# Patient Record
Sex: Female | Born: 1970 | Hispanic: No | Marital: Married | Smoking: Never smoker
Health system: Southern US, Community
[De-identification: ages and names within clinical notes are randomized; demographics above are authoritative.]

---

## 2003-06-12 ENCOUNTER — Ambulatory Visit (HOSPITAL_COMMUNITY): Admission: RE | Admit: 2003-06-12 | Discharge: 2003-06-12 | Payer: Self-pay | Admitting: Family Medicine

## 2005-08-31 ENCOUNTER — Other Ambulatory Visit: Admission: RE | Admit: 2005-08-31 | Discharge: 2005-08-31 | Payer: Self-pay

## 2006-04-19 ENCOUNTER — Other Ambulatory Visit: Admission: RE | Admit: 2006-04-19 | Discharge: 2006-04-19 | Payer: Self-pay | Admitting: Unknown Physician Specialty

## 2011-01-01 ENCOUNTER — Emergency Department (HOSPITAL_COMMUNITY): Payer: Managed Care, Other (non HMO)

## 2011-01-01 ENCOUNTER — Emergency Department (HOSPITAL_COMMUNITY)
Admission: EM | Admit: 2011-01-01 | Discharge: 2011-01-02 | Disposition: A | Discharge status: Home / Self Care | Payer: Managed Care, Other (non HMO) | Attending: Emergency Medicine | Admitting: Emergency Medicine

## 2011-01-01 DIAGNOSIS — R110 Nausea: Secondary | ICD-10-CM | POA: Insufficient documentation

## 2011-01-01 DIAGNOSIS — R079 Chest pain, unspecified: Secondary | ICD-10-CM | POA: Insufficient documentation

## 2011-01-01 DIAGNOSIS — R55 Syncope and collapse: Secondary | ICD-10-CM | POA: Insufficient documentation

## 2011-01-01 DIAGNOSIS — R0609 Other forms of dyspnea: Secondary | ICD-10-CM | POA: Insufficient documentation

## 2011-01-01 DIAGNOSIS — R0989 Other specified symptoms and signs involving the circulatory and respiratory systems: Secondary | ICD-10-CM | POA: Insufficient documentation

## 2011-01-01 LAB — URINALYSIS, ROUTINE W REFLEX MICROSCOPIC
Bilirubin Urine: NEGATIVE
Glucose, UA: NEGATIVE mg/dL
Hgb urine dipstick: NEGATIVE
Ketones, ur: NEGATIVE mg/dL
Leukocytes, UA: NEGATIVE
Nitrite: NEGATIVE
Protein, ur: NEGATIVE mg/dL
Specific Gravity, Urine: 1.01 (ref 1.005–1.030)
Urobilinogen, UA: 1 mg/dL (ref 0.0–1.0)
pH: 6.5 (ref 5.0–8.0)

## 2011-01-01 LAB — CBC
HCT: 36.2 % (ref 36.0–46.0)
Hemoglobin: 12.4 g/dL (ref 12.0–15.0)
MCH: 28.1 pg (ref 26.0–34.0)
MCHC: 34.3 g/dL (ref 30.0–36.0)
MCV: 81.9 fL (ref 78.0–100.0)
Platelets: 324 10*3/uL (ref 150–400)
RBC: 4.42 MIL/uL (ref 3.87–5.11)
RDW: 12.9 % (ref 11.5–15.5)
WBC: 10.6 10*3/uL — ABNORMAL HIGH (ref 4.0–10.5)

## 2011-01-01 LAB — DIFFERENTIAL
Basophils Absolute: 0 10*3/uL (ref 0.0–0.1)
Basophils Relative: 0 % (ref 0–1)
Eosinophils Absolute: 0.1 10*3/uL (ref 0.0–0.7)
Eosinophils Relative: 1 % (ref 0–5)
Lymphocytes Relative: 23 % (ref 12–46)
Lymphs Abs: 2.4 10*3/uL (ref 0.7–4.0)
Monocytes Absolute: 1.2 10*3/uL — ABNORMAL HIGH (ref 0.1–1.0)
Monocytes Relative: 12 % (ref 3–12)
Neutro Abs: 6.8 10*3/uL (ref 1.7–7.7)
Neutrophils Relative %: 64 % (ref 43–77)

## 2011-01-01 LAB — CK TOTAL AND CKMB (NOT AT ARMC)
CK, MB: 3 ng/mL (ref 0.3–4.0)
Relative Index: INVALID (ref 0.0–2.5)
Total CK: 75 U/L (ref 7–177)

## 2011-01-01 LAB — POCT PREGNANCY, URINE: Preg Test, Ur: NEGATIVE

## 2011-01-01 LAB — BASIC METABOLIC PANEL
BUN: 11 mg/dL (ref 6–23)
CO2: 25 mEq/L (ref 19–32)
Calcium: 8.7 mg/dL (ref 8.4–10.5)
Chloride: 106 mEq/L (ref 96–112)
Creatinine, Ser: 0.62 mg/dL (ref 0.50–1.10)
GFR calc Af Amer: >60 mL/min (ref >60)
GFR calc non Af Amer: >60 mL/min (ref >60)
Glucose, Bld: 94 mg/dL (ref 70–99)
Potassium: 3.8 mEq/L (ref 3.5–5.1)
Sodium: 139 mEq/L (ref 135–145)

## 2011-01-01 LAB — TROPONIN I: Troponin I: <0.3 ng/mL (ref <0.30)

## 2011-01-01 LAB — D-DIMER, QUANTITATIVE: D-Dimer, Quant: 0.86 ug/mL-FEU — ABNORMAL HIGH (ref 0.00–0.48)

## 2011-01-01 MED ORDER — IOHEXOL 350 MG/ML SOLN
100.0000 mL | Freq: Once | INTRAVENOUS | Status: AC | PRN
  Administered 2011-01-01: 100 mL via INTRAVENOUS

## 2011-03-30 ENCOUNTER — Encounter: Payer: Self-pay | Admitting: *Deleted

## 2011-03-30 ENCOUNTER — Emergency Department (HOSPITAL_COMMUNITY)
Admission: EM | Admit: 2011-03-30 | Discharge: 2011-03-31 | Disposition: A | Discharge status: Home / Self Care | Payer: Managed Care, Other (non HMO) | Attending: Emergency Medicine | Admitting: Emergency Medicine

## 2011-03-30 DIAGNOSIS — M542 Cervicalgia: Secondary | ICD-10-CM | POA: Insufficient documentation

## 2011-03-30 DIAGNOSIS — R51 Headache: Secondary | ICD-10-CM | POA: Insufficient documentation

## 2011-03-30 DIAGNOSIS — M545 Low back pain, unspecified: Secondary | ICD-10-CM | POA: Insufficient documentation

## 2011-03-30 LAB — URINE MICROSCOPIC-ADD ON

## 2011-03-30 LAB — URINALYSIS, ROUTINE W REFLEX MICROSCOPIC
Bilirubin Urine: NEGATIVE
Glucose, UA: NEGATIVE mg/dL
Ketones, ur: NEGATIVE mg/dL
Nitrite: NEGATIVE
Protein, ur: NEGATIVE mg/dL
Specific Gravity, Urine: 1.025 (ref 1.005–1.030)
Urobilinogen, UA: 0.2 mg/dL (ref 0.0–1.0)
pH: 5.5 (ref 5.0–8.0)

## 2011-03-30 LAB — POCT PREGNANCY, URINE: Preg Test, Ur: NEGATIVE

## 2011-03-30 MED ORDER — ACETAMINOPHEN 500 MG PO TABS
1000.0000 mg | ORAL_TABLET | Freq: Once | ORAL | Status: AC
  Administered 2011-03-30: 1000 mg via ORAL
  Filled 2011-03-30: qty 2

## 2011-03-30 NOTE — ED Notes (Signed)
Pt reports headache x 2 days and lower back pain x 2 weeks

## 2011-03-31 LAB — PREGNANCY, URINE: Preg Test, Ur: NEGATIVE

## 2011-03-31 MED ORDER — BUTALBITAL-APAP-CAFFEINE 50-325-40 MG PO TABS: 1.0000 | ORAL_TABLET | Freq: Four times a day (QID) | ORAL | Status: AC | PRN

## 2011-03-31 MED ORDER — ACYCLOVIR 800 MG PO TABS: 800.0000 mg | ORAL_TABLET | Freq: Every day | ORAL | Status: AC

## 2011-03-31 NOTE — ED Notes (Signed)
Pt stating pain free and ready to go home

## 2011-03-31 NOTE — ED Notes (Signed)
Pt left the er stating no needs 

## 2011-04-02 NOTE — ED Provider Notes (Signed)
History     CSN: [id number] Arrival date & time: 03/30/2011 10:30 PM  Chief Complaint  Patient presents with  . Headache  . Back Pain    (Consider location/radiation/quality/duration/timing/severity/associated sxs/prior treatment) Patient is a 40 y.o. female presenting with back pain. The history is provided by the patient.  Back Pain  This is a new problem. The current episode started more than 1 week ago. The problem occurs constantly. Progression since onset: Waxing and waning. Associated with: No obvious known injury, but does work in a physical job with frequent lifting and bending. The pain is present in the lumbar spine. The quality of the pain is described as aching. The pain does not radiate. The pain is at a severity of 6/10. The pain is moderate. The symptoms are aggravated by certain positions and bending. Pertinent negatives include no chest pain, no fever, no numbness, no headaches, no abdominal pain and no weakness. Associated symptoms comments: She reports having a posterior neck and scalp pain which she describes as a burning sensation for thepast 2 days.  Denies nasal congestion,  Cough,  Fever, no photophobia,  Nausea or vomting,  No neck stiffness.  She has not taken any headache pain relievers.  She denies rash.  She does relate increased personal stressors.  Denies injury and fall.. She has tried nothing for the symptoms.    History reviewed. No pertinent past medical history.  History reviewed. No pertinent past surgical history.  No family history on file.  History  Substance Use Topics  . Smoking status: Not on file  . Smokeless tobacco: Not on file  . Alcohol Use: No    OB History    Grav Para Term Preterm Abortions TAB SAB Ect Mult Living                  Review of Systems  Constitutional: Negative for fever.  HENT: Positive for neck pain. Negative for congestion, sore throat and neck stiffness.   Eyes: Negative.   Respiratory: Negative for chest  tightness and shortness of breath.   Cardiovascular: Negative for chest pain.  Gastrointestinal: Negative for nausea and abdominal pain.  Genitourinary: Negative.   Musculoskeletal: Positive for back pain. Negative for myalgias, joint swelling, arthralgias and gait problem.  Skin: Negative for rash and wound.       Posterior scalp pain  Neurological: Negative for dizziness, weakness, light-headedness, numbness and headaches.  Hematological: Negative.   Psychiatric/Behavioral: Negative.     Allergies  Review of patient's allergies indicates no known allergies.  Home Medications   Current Outpatient Rx  Name Route Sig Dispense Refill  . ETONOGESTREL-ETHINYL ESTRADIOL 0.12-0.015 MG/24HR VA RING Vaginal Place 1 each vaginally every 28 (twenty-eight) days. Insert vaginally and leave in place for 3 consecutive weeks, then remove for 1 week.     Marland Kitchen NAPROSYN PO Oral Take 1 tablet by mouth as needed. For pain     . ACYCLOVIR 800 MG PO TABS Oral Take 1 tablet (800 mg total) by mouth 5 (five) times daily. 50 tablet 0  . BUTALBITAL-APAP-CAFFEINE 50-325-40 MG PO TABS Oral Take 1-2 tablets by mouth every 6 (six) hours as needed for headache. 20 tablet 0    BP 114/86  Pulse 74  Temp(Src) 98.5 F (36.9 C) (Oral)  Resp 12  Ht 4\' 9"  (1.448 m)  Wt 144 lb (65.318 kg)  BMI 31.16 kg/m2  SpO2 99%  LMP 03/24/2011  Physical Exam  Nursing note and vitals reviewed. Constitutional: She is  oriented to person, place, and time. She appears well-developed and well-nourished.  HENT:  Head: Normocephalic and atraumatic.  Eyes: Conjunctivae are normal.  Neck: Normal range of motion. Neck supple.  Cardiovascular: Normal rate, regular rhythm, normal heart sounds and intact distal pulses.        Pedal pulses normal.  Pulmonary/Chest: Effort normal and breath sounds normal. She has no wheezes.  Abdominal: Soft. Bowel sounds are normal. She exhibits no distension and no mass. There is no tenderness.    Musculoskeletal: Normal range of motion. She exhibits no edema.       Lumbar back: She exhibits tenderness. She exhibits normal range of motion, no bony tenderness, no swelling, no edema and no spasm.  Neurological: She is alert and oriented to person, place, and time. She has normal strength. She displays no atrophy and no tremor. No cranial nerve deficit or sensory deficit. Gait normal.  Reflex Scores:      Patellar reflexes are 2+ on the right side and 2+ on the left side.      Achilles reflexes are 2+ on the right side and 2+ on the left side.      No strength deficit noted in hip and knee flexor and extensor muscle groups.  Ankle flexion and extension intact.  Skin: Skin is warm and dry.  Psychiatric: She has a normal mood and affect.    ED Course  Procedures (including critical care time)   Results for orders placed during the hospital encounter of 03/30/11  URINALYSIS, ROUTINE W REFLEX MICROSCOPIC      Component Value Range   Color, Urine YELLOW  YELLOW    Appearance CLEAR  CLEAR    Specific Gravity, Urine 1.025  1.005 - 1.030    pH 5.5  5.0 - 8.0    Glucose, UA NEGATIVE  NEGATIVE (mg/dL)   Hgb urine dipstick SMALL (*) NEGATIVE    Bilirubin Urine NEGATIVE  NEGATIVE    Ketones, ur NEGATIVE  NEGATIVE (mg/dL)   Protein, ur NEGATIVE  NEGATIVE (mg/dL)   Urobilinogen, UA 0.2  0.0 - 1.0 (mg/dL)   Nitrite NEGATIVE  NEGATIVE    Leukocytes, UA TRACE (*) NEGATIVE   URINE MICROSCOPIC-ADD ON      Component Value Range   Squamous Epithelial / LPF MANY (*) RARE    WBC, UA 0-2  <3 (WBC/hpf)   RBC / HPF 3-6  <3 (RBC/hpf)   Bacteria, UA FEW (*) RARE   PREGNANCY, URINE      Component Value Range   Preg Test, Ur NEGATIVE    POCT PREGNANCY, URINE      Component Value Range   Preg Test, Ur NEGATIVE     No results found.     1. Headache       MDM  Urinalysis contaminated with epithelial cells,  No urinary sx.  Doubt uti as source of back pain,  Which is positional and  reproducible.  Discussed with patient that the burning posterior neck and scalp pain could be an early prodrome of shingles (pt has had chicken pox as child).  Prescribed acyclovir with strict instructions to get filled only if she develops rash at the site of this burning pain,  And to f/u with pcp for recheck. Also prescribed fioricet for tension headache relief which she will fill now.        Candis Musa, PA 04/02/11 1332

## 2011-05-10 NOTE — ED Provider Notes (Signed)
Medical screening examination/treatment/procedure(s) were performed by non-physician practitioner and as supervising physician I was immediately available for consultation/collaboration.  Jasmine Awe, MD 05/10/11 [phone]

## 2011-09-30 ENCOUNTER — Encounter (HOSPITAL_COMMUNITY): Payer: Self-pay | Admitting: *Deleted

## 2011-09-30 ENCOUNTER — Emergency Department (HOSPITAL_COMMUNITY)
Admission: EM | Admit: 2011-09-30 | Discharge: 2011-09-30 | Disposition: A | Discharge status: Home / Self Care | Payer: Managed Care, Other (non HMO) | Attending: Emergency Medicine | Admitting: Emergency Medicine

## 2011-09-30 DIAGNOSIS — R51 Headache: Secondary | ICD-10-CM | POA: Insufficient documentation

## 2011-09-30 MED ORDER — KETOROLAC TROMETHAMINE 60 MG/2ML IM SOLN
60.0000 mg | Freq: Once | INTRAMUSCULAR | Status: AC
  Administered 2011-09-30: 60 mg via INTRAMUSCULAR
  Filled 2011-09-30: qty 2

## 2011-09-30 MED ORDER — NAPROXEN 500 MG PO TABS: 500.0000 mg | ORAL_TABLET | Freq: Two times a day (BID) | ORAL | Status: DC

## 2011-09-30 NOTE — Discharge Instructions (Signed)
Headache:  You are having a headache. No specific cause was found today for your headache. It may have been a migraine or other cause of headache. Stress, anxiety, fatigue, and depression are common triggers for headaches. Your headache today does not appear to be life-threatening or require hospitalization, but often the exact cause of headaches is not determined in the emergency department. Therefore, followup with your doctor is very important to find out what may have caused your headache, and whether or not you need any further diagnostic testing or treatment. Sometimes headaches can appear benign but then more serious symptoms can develop which should prompt an immediate reevaluation by your doctor or the emergency department.  Seek immediate medical attention if:  You develop possible problems with medications prescribed. The medications don't resolve your headache, if it recurs, or if you have multiple episodes of vomiting or can't take fluids by mouth You have a change from the usual headache. If you developed a sudden severe headache or confusion, become poorly responsive or faint, developed a fever above 100.4 or problems breathing, have a change in speech, vision, swallowing or understanding, or developed new weakness, numbness, tingling, incoordination or have a seizure.  If you don't have a family doctor to follow up with, see the follow up list below - call this morning for a follow-up appointment in the next 1-2 days.  RESOURCE GUIDE  Dental Problems  Patients with Medicaid: [facility]                     [facility] [address]                                           [address] Phone:  [phone]                                                  Phone:  [phone]  If unable to pay or uninsured, contact:  [facility] or [facility] to become qualified for the adult dental clinic.  Chronic Pain Problems Contact [facility]  [phone] Patients need to be referred by their primary care doctor.  Insufficient Money for Medicine Contact [employer]:  call "211" or [facility] 271-5999.  No Primary Care Doctor Call [facility]  832-8000 Other agencies that provide inexpensive medical care    [facility]  [phone]    [facility]  [phone]    [facility]  [phone]    Women[facility] Clinic  [phone]    [facility]  [phone]    [facility]  [phone]  Psychological Services [facility]  [phone] [facility]  [phone] [facility]   800 853-5163 (emergency services 641-4993)  Substance Abuse Resources [facility]  336-882-2125 [facility] 336-784-9470 [facility] 336-285-9073 [facility] 336-845-3988 Residential & Outpatient Substance Abuse Program  800-659-3381  Abuse/Neglect [place] Child Abuse Hotline (336) 641-3795 [place] Child Abuse Hotline 800-378-5315 (After Hours)  Emergency Shelter [facility] (336) 271-5985  [facility] (336) 275-9566 [facility] (704) 372-4663  MRSA Hotline #:     832-7006    [place] [facility]  [facility] [place]     [employer]                          [facility] [address] [place]                       [address]      [address]  [place]                                                [place]                            [place] Phone:  [phone]                                   Phone:  [phone]                 Phone:  [phone]  [facility] Phone:  [phone]  [place] Child Abuse Hotline (336) 342-1394 (336) 342-3537 (After Hours)   

## 2011-09-30 NOTE — ED Provider Notes (Signed)
History     CSN: [id number]  Arrival date & time 09/30/11  0405   First MD Initiated Contact with Patient 09/30/11 0422      Chief Complaint  Patient presents with  . Headache    (Consider location/radiation/quality/duration/timing/severity/associated sxs/prior treatment) HPI Comments: 21 atrial female with a history of intermittent headaches presents with a complaint of a posterior headache which is throbbing in nature, started approximately one week ago, has waxed and waned for the last 7 days sometimes it gets worse than others. She denies fevers, stiff neck, chest pain, shortness of breath, abdominal pain, vomiting fevers chills dysuria diarrhea or rashes. She denies any changes in her vision, weakness numbness of the upper or lower extremities. She states that she woke up a short time ago when the headache was a little bit worse. At that time she felt like her left arm and left leg were asleep. These symptoms have gradually improved and she is back to baseline with regard to that complaint. The headache persists, is throbbing and nothing makes it better or worse. Over the week she has treated this with Naprosyn and Tylenol with good improvement.  Patient is a 41 y.o. female presenting with headaches. The history is provided by the patient and medical records.  Headache     History reviewed. No pertinent past medical history.  History reviewed. No pertinent past surgical history.  No family history on file.  History  Substance Use Topics  . Smoking status: Not on file  . Smokeless tobacco: Not on file  . Alcohol Use: No    OB History    Grav Para Term Preterm Abortions TAB SAB Ect Mult Living                  Review of Systems  Neurological: Positive for headaches.  All other systems reviewed and are negative.    Allergies  Review of patient's allergies indicates no known allergies.  Home Medications   Current Outpatient Rx  Name Route Sig Dispense Refill  .  NAPROSYN PO Oral Take 1 tablet by mouth as needed. For pain     . BUTALBITAL-APAP-CAFFEINE 50-325-40 MG PO TABS Oral Take 1-2 tablets by mouth every 6 (six) hours as needed for headache. 20 tablet 0  . ETONOGESTREL-ETHINYL ESTRADIOL 0.12-0.015 MG/24HR VA RING Vaginal Place 1 each vaginally every 28 (twenty-eight) days. Insert vaginally and leave in place for 3 consecutive weeks, then remove for 1 week.     Marland Kitchen NAPROXEN 500 MG PO TABS Oral Take 1 tablet (500 mg total) by mouth 2 (two) times daily with a meal. 30 tablet 0    BP 124/84  Temp(Src) 98 F (36.7 C) (Oral)  Resp 20  Ht 4\' 11"  (1.499 m)  Wt 146 lb (66.225 kg)  BMI 29.49 kg/m2  SpO2 99%  LMP 09/15/2011  Physical Exam  Nursing note and vitals reviewed. Constitutional: She appears well-developed and well-nourished. No distress.  HENT:  Head: Normocephalic and atraumatic.  Mouth/Throat: Oropharynx is clear and moist. No oropharyngeal exudate.  Eyes: Conjunctivae and EOM are normal. Pupils are equal, round, and reactive to light. Right eye exhibits no discharge. Left eye exhibits no discharge. No scleral icterus.  Neck: Normal range of motion. Neck supple. No JVD present. No thyromegaly present.  Cardiovascular: Normal rate, regular rhythm, normal heart sounds and intact distal pulses.  Exam reveals no gallop and no friction rub.   No murmur heard. Pulmonary/Chest: Effort normal and breath sounds normal. No respiratory  distress. She has no wheezes. She has no rales.  Abdominal: Soft. Bowel sounds are normal. She exhibits no distension and no mass. There is no tenderness.  Musculoskeletal: Normal range of motion. She exhibits no edema and no tenderness.       There is no tenderness over the posterior occiput, posterior cervical spine or paraspinal muscles  Lymphadenopathy:    She has no cervical adenopathy.  Neurological: She is alert. Coordination normal.       Normal strength and sensation to light touch and pinprick of the  bilateral upper and lower extremities. Normal speech, normal gait, normal coordination of the bilateral upper extremities and lower extremities. Extraocular movements are normal, pupillary exam is normal, cranial nerves 3 through 12 are normal. There is no nystagmus.  Skin: Skin is warm and dry. No rash noted. No erythema.  Psychiatric: She has a normal mood and affect. Her behavior is normal.    ED Course  Procedures (including critical care time)  Labs Reviewed - No data to display No results found.   1. Headache       MDM  Physical exam is benign, vital signs are normal, see below. Will start treatment with intramuscular Toradol, patient has driven herself here and does not want potentially sedating medications.  Filed Vitals:   09/30/11 0412  BP: 124/84  Temp: 98 F (36.7 C)  Resp: 20    Toradol with complete improvement in headache, vital signs remain normal, patient requests discharge.  Discharge Prescriptions include:  Naprosyn        Vida Roller, MD 09/30/11 [phone]

## 2011-11-03 ENCOUNTER — Emergency Department (HOSPITAL_COMMUNITY)
Admission: EM | Admit: 2011-11-03 | Discharge: 2011-11-04 | Disposition: A | Discharge status: Home / Self Care | Payer: Managed Care, Other (non HMO) | Attending: Emergency Medicine | Admitting: Emergency Medicine

## 2011-11-03 ENCOUNTER — Encounter (HOSPITAL_COMMUNITY): Payer: Self-pay | Admitting: Emergency Medicine

## 2011-11-03 DIAGNOSIS — R51 Headache: Secondary | ICD-10-CM

## 2011-11-03 NOTE — ED Notes (Signed)
Patient states she has had a headache x 2 weeks; states has been to doctor and given antibiotic.  Nasal congestion, denies cough.

## 2011-11-04 ENCOUNTER — Encounter (HOSPITAL_COMMUNITY): Payer: Self-pay | Admitting: Emergency Medicine

## 2011-11-04 MED ORDER — KETOROLAC TROMETHAMINE 60 MG/2ML IM SOLN
60.0000 mg | Freq: Once | INTRAMUSCULAR | Status: AC
  Administered 2011-11-04: 60 mg via INTRAMUSCULAR
  Filled 2011-11-04: qty 2

## 2011-11-04 MED ORDER — BUTALBITAL-APAP-CAFFEINE 50-325-40 MG PO TABS: 1.0000 | ORAL_TABLET | Freq: Four times a day (QID) | ORAL | Status: DC | PRN

## 2011-11-04 MED ORDER — CYCLOBENZAPRINE HCL 10 MG PO TABS: 10.0000 mg | ORAL_TABLET | Freq: Three times a day (TID) | ORAL | Status: AC | PRN

## 2011-11-04 MED ORDER — METOCLOPRAMIDE HCL 5 MG/ML IJ SOLN
10.0000 mg | Freq: Once | INTRAMUSCULAR | Status: AC
  Administered 2011-11-04: 10 mg via INTRAMUSCULAR
  Filled 2011-11-04: qty 2

## 2011-11-04 NOTE — Discharge Instructions (Signed)

## 2011-11-04 NOTE — ED Provider Notes (Signed)
History     CSN: [id number]  Arrival date & time 11/03/11  2247   First MD Initiated Contact with Patient 11/04/11 [phone]      Chief Complaint  Patient presents with  . Headache    (Consider location/radiation/quality/duration/timing/severity/associated sxs/prior treatment) HPI Comments: Patient c/o persistent headache and nasal congestion for 2 weeks.  Describes the headache as throbbing in her left temple and radiates to the top of her head.  She states she was seen at the local urgent care today and is being treated with augmentin for a sinus infection.  She states the headache began gradually and feels similar to previous headaches.  She also c/o nausea but denies neck stiffness, vomiting, chest pain, fever, sore throat, rashes or visual changes.    Patient is a 41 y.o. female presenting with headaches. The history is provided by the patient.  Headache  This is a recurrent problem. The current episode started more than 1 week ago. The problem occurs constantly. The problem has not changed since onset.The headache is associated with nothing. The pain is located in the left unilateral and frontal region. The quality of the pain is described as throbbing. The pain is moderate. The pain does not radiate. Associated symptoms include nausea. Pertinent negatives include no anorexia, no fever, no malaise/fatigue, no near-syncope, no palpitations, no syncope, no shortness of breath and no vomiting. She has tried NSAIDs and acetaminophen for the symptoms. The treatment provided no relief.    History reviewed. No pertinent past medical history.  History reviewed. No pertinent past surgical history.  History reviewed. No pertinent family history.  History  Substance Use Topics  . Smoking status: Not on file  . Smokeless tobacco: Not on file  . Alcohol Use: No    OB History    Grav Para Term Preterm Abortions TAB SAB Ect Mult Living                  Review of Systems  Constitutional:  Negative for fever, malaise/fatigue, activity change and appetite change.  HENT: Positive for sinus pressure. Negative for sore throat, facial swelling, trouble swallowing and neck stiffness.   Eyes: Positive for photophobia. Negative for pain and visual disturbance.  Respiratory: Negative for shortness of breath.   Cardiovascular: Negative for chest pain, palpitations, syncope and near-syncope.  Gastrointestinal: Positive for nausea. Negative for vomiting and anorexia.  Skin: Negative for rash and wound.  Neurological: Positive for headaches. Negative for dizziness, syncope, facial asymmetry, speech difficulty, weakness and numbness.  Psychiatric/Behavioral: Negative for confusion and decreased concentration.  All other systems reviewed and are negative.    Allergies  Review of patient's allergies indicates no known allergies.  Home Medications   Current Outpatient Rx  Name Route Sig Dispense Refill  . AMOXICILLIN-POT CLAVULANATE ER 1000-62.5 MG PO TB12 Oral Take 2 tablets by mouth 2 (two) times daily.    Marland Kitchen FEXOFENADINE-PSEUDOEPHED ER 60-120 MG PO TB12 Oral Take 1 tablet by mouth 2 (two) times daily.    Marland Kitchen BUTALBITAL-APAP-CAFFEINE 50-325-40 MG PO TABS Oral Take 1-2 tablets by mouth every 6 (six) hours as needed for headache. 20 tablet 0  . ETONOGESTREL-ETHINYL ESTRADIOL 0.12-0.015 MG/24HR VA RING Vaginal Place 1 each vaginally every 28 (twenty-eight) days. Insert vaginally and leave in place for 3 consecutive weeks, then remove for 1 week.     Marland Kitchen NAPROSYN PO Oral Take 1 tablet by mouth as needed. For pain     . NAPROXEN 500 MG PO TABS Oral Take  1 tablet (500 mg total) by mouth 2 (two) times daily with a meal. 30 tablet 0    BP 122/84  Pulse 94  Temp(Src) 97.8 F (36.6 C) (Oral)  Resp 20  Ht 4\' 11"  (1.499 m)  Wt 146 lb (66.225 kg)  BMI 29.49 kg/m2  SpO2 100%  LMP 10/15/2011  Physical Exam  Nursing note and vitals reviewed. Constitutional: She is oriented to person, place, and  time. She appears well-developed and well-nourished. No distress.  HENT:  Head: Normocephalic and atraumatic.  Right Ear: Tympanic membrane and ear canal normal.  Left Ear: Tympanic membrane and ear canal normal.  Mouth/Throat: Uvula is midline, oropharynx is clear and moist and mucous membranes are normal.  Neck: Normal range of motion, full passive range of motion without pain and phonation normal. Neck supple. No Brudzinski's sign and no Kernig's sign noted.  Cardiovascular: Normal rate, regular rhythm and normal heart sounds.   No murmur heard. Pulmonary/Chest: Effort normal and breath sounds normal. No respiratory distress.  Abdominal: Soft. She exhibits no distension. There is no tenderness.  Musculoskeletal: Normal range of motion. She exhibits no edema and no tenderness.  Lymphadenopathy:    She has no cervical adenopathy.  Neurological: She is alert and oriented to person, place, and time. No cranial nerve deficit or sensory deficit. She exhibits normal muscle tone. Coordination and gait normal. GCS eye subscore is 4. GCS verbal subscore is 5. GCS motor subscore is 6.  Skin: Skin is warm and dry.    ED Course  Procedures (including critical care time)      MDM   Previous medical charts, nursing notes and vitals signs from this visit were reviewed by me   All laboratory results and/or imaging results performed on this visit, if applicable, were reviewed by me and discussed with the patient and/or parent as well as recommendation for follow-up    MEDICATIONS GIVEN IN ED:  IM toradol, Reglan   Patient is alert, non-toxic appearing.  No meningeal signs.  No focal neuro deficits on exam.  Ambulates well.  She was seen by the local urgent care today and started on Augmentin and allegra.  Patient has hx of frequent headaches and pain tonight is similar to previous.  I doubt malignant source.  She agrees to f/u with her PMD in [place], [place].       PRESCRIPTIONS GIVEN AT  DISCHARGE:  fioricet #15, flexeril     Pt stable in ED with no significant deterioration in condition. Pt feels improved after observation and/or treatment in ED. Patient / Family / Caregiver understand and agree with initial ED impression and plan with expectations set for ED visit.  Patient agrees to return to ED for any worsening symptoms       Calianne Larue L. [place], [place] 11/04/11 2440

## 2011-11-04 NOTE — ED Provider Notes (Signed)
Medical screening examination/treatment/procedure(s) were performed by non-physician practitioner and as supervising physician I was immediately available for consultation/collaboration.   Amour Trigg M Zahraa Bhargava, DO 11/04/11 0646 

## 2012-06-01 ENCOUNTER — Emergency Department (HOSPITAL_COMMUNITY): Payer: Managed Care, Other (non HMO)

## 2012-06-01 ENCOUNTER — Emergency Department (HOSPITAL_COMMUNITY)
Admission: EM | Admit: 2012-06-01 | Discharge: 2012-06-02 | Disposition: A | Discharge status: Home / Self Care | Payer: Managed Care, Other (non HMO) | Attending: Emergency Medicine | Admitting: Emergency Medicine

## 2012-06-01 DIAGNOSIS — R0789 Other chest pain: Secondary | ICD-10-CM

## 2012-06-01 DIAGNOSIS — E876 Hypokalemia: Secondary | ICD-10-CM | POA: Insufficient documentation

## 2012-06-01 DIAGNOSIS — R071 Chest pain on breathing: Secondary | ICD-10-CM | POA: Insufficient documentation

## 2012-06-01 DIAGNOSIS — R0602 Shortness of breath: Secondary | ICD-10-CM | POA: Insufficient documentation

## 2012-06-01 DIAGNOSIS — Z3202 Encounter for pregnancy test, result negative: Secondary | ICD-10-CM | POA: Insufficient documentation

## 2012-06-01 LAB — CBC
HCT: 37.9 % (ref 36.0–46.0)
MCV: 82.9 fL (ref 78.0–100.0)
RBC: 4.57 MIL/uL (ref 3.87–5.11)
RDW: 13.2 % (ref 11.5–15.5)
WBC: 9.8 10*3/uL (ref 4.0–10.5)

## 2012-06-01 LAB — POCT PREGNANCY, URINE: Preg Test, Ur: NEGATIVE

## 2012-06-01 LAB — LIPASE, BLOOD: Lipase: 33 U/L (ref 11–59)

## 2012-06-01 LAB — BASIC METABOLIC PANEL
BUN: 18 mg/dL (ref 6–23)
CO2: 27 mEq/L (ref 19–32)
Chloride: 103 mEq/L (ref 96–112)
Creatinine, Ser: 0.68 mg/dL (ref 0.50–1.10)
GFR calc Af Amer: >90 mL/min (ref >90)
Potassium: 3.1 mEq/L — ABNORMAL LOW (ref 3.5–5.1)

## 2012-06-01 LAB — HEPATIC FUNCTION PANEL
AST: 13 U/L (ref 0–37)
Bilirubin, Direct: <0.1 mg/dL (ref 0.0–0.3)
Total Bilirubin: 0.2 mg/dL — ABNORMAL LOW (ref 0.3–1.2)

## 2012-06-01 LAB — CK TOTAL AND CKMB (NOT AT ARMC)
CK, MB: 1.9 ng/mL (ref 0.3–4.0)
Relative Index: INVALID (ref 0.0–2.5)

## 2012-06-01 LAB — TROPONIN I: Troponin I: <0.3 ng/mL (ref <0.30)

## 2012-06-01 MED ORDER — DIPHENHYDRAMINE HCL 50 MG/ML IJ SOLN
25.0000 mg | Freq: Once | INTRAMUSCULAR | Status: AC
  Administered 2012-06-01: 25 mg via INTRAVENOUS
  Filled 2012-06-01: qty 1

## 2012-06-01 MED ORDER — METOCLOPRAMIDE HCL 5 MG/ML IJ SOLN
10.0000 mg | Freq: Once | INTRAMUSCULAR | Status: AC
  Administered 2012-06-01: 10 mg via INTRAVENOUS
  Filled 2012-06-01: qty 2

## 2012-06-01 MED ORDER — POTASSIUM CHLORIDE CRYS ER 20 MEQ PO TBCR: 20.0000 meq | EXTENDED_RELEASE_TABLET | Freq: Two times a day (BID) | ORAL | Status: DC

## 2012-06-01 MED ORDER — KETOROLAC TROMETHAMINE 30 MG/ML IJ SOLN
30.0000 mg | Freq: Once | INTRAMUSCULAR | Status: AC
  Administered 2012-06-01: 30 mg via INTRAVENOUS
  Filled 2012-06-01: qty 1

## 2012-06-01 NOTE — ED Notes (Signed)
Pt reports pain in upper mid chest.  Reports pain began about 30 min ago after eating.  Pt also reporting headache.  Denies nausea, vomiting, difficulty swallowing.

## 2012-06-01 NOTE — ED Provider Notes (Signed)
History   This chart was scribed for Ward Givens, MD by Gerlean Ren, ED Scribe. This patient was seen in room APA01/APA01 and the patient's care was started at 9:54 PM    CSN: [id number]  Arrival date & time 06/01/12  2123   First MD Initiated Contact with Patient 06/01/12 2143      Chief Complaint  Patient presents with  . Chest Pain     The history is provided by the patient. No language interpreter was used.   Sharon White is a 41 y.o. female who presents to the Emergency Department complaining of one hour of waxing-and-waning "pressure" chest pain lasting 20 minutes at a time that has no modifying factors with associated HA that is described as a pressure on top of her head, dyspnea and abdominal pain under bilateral ribs. Nothing makes the pain better, nothing makes it worse.  Pt denies associated wheezing, nausea, emesis, diarrhea, cough, and fever.  Pt reports h/o similar pain one year ago but that current pain was brought on by unspecified allergies.    PCP is Dr. Willaim Bane in [place].  No past medical history on file.  No past surgical history on file.  No family history on file.  Pt states maternal uncle h/o CAD.   History  Substance Use Topics  . Smoking status: No  . Smokeless tobacco: Not on file  . Alcohol Use: No  employed Lives at home Lives with spouse  No OB history provided.   Review of Systems  Constitutional: Negative for fever.  Respiratory: Positive for shortness of breath. Negative for cough and wheezing.   Cardiovascular: Positive for chest pain.  Gastrointestinal: Negative for nausea, vomiting, abdominal pain and diarrhea.  All other systems reviewed and are negative.    Allergies  Review of patient's allergies indicates no known allergies.  Home Medications     BP 111/81  Pulse 100  Temp 97.8 F (36.6 C) (Oral)  Resp 16  Ht 4\' 9"  (1.448 m)  Wt 146 lb (66.225 kg)  BMI 31.59 kg/m2  SpO2 96%  LMP 05/03/2012  Vital signs  normal Pulse ox 100% during my exam   Physical Exam  Nursing note and vitals reviewed. Constitutional: She is oriented to person, place, and time. She appears well-developed and well-nourished.  Non-toxic appearance. She does not appear ill. No distress.  HENT:  Head: Normocephalic and atraumatic.  Right Ear: External ear normal.  Left Ear: External ear normal.  Nose: Nose normal. No mucosal edema or rhinorrhea.  Mouth/Throat: Oropharynx is clear and moist and mucous membranes are normal. No dental abscesses or uvula swelling.  Eyes: Conjunctivae normal and EOM are normal. Pupils are equal, round, and reactive to light.  Neck: Normal range of motion and full passive range of motion without pain. Neck supple.  Cardiovascular: Normal rate, regular rhythm and normal heart sounds.  Exam reveals no gallop and no friction rub.   No murmur heard. Pulmonary/Chest: Effort normal and breath sounds normal. No respiratory distress. She has no wheezes. She has no rhonchi. She has no rales. She exhibits no crepitus.         Tender over right costochondral junctions.  Abdominal: Soft. Normal appearance and bowel sounds are normal. She exhibits no distension. There is tenderness. There is no rebound and no guarding.       Mild epigastric and RUQ pain.  Musculoskeletal: Normal range of motion. She exhibits no edema and no tenderness.  Moves all extremities well.   Neurological: She is alert and oriented to person, place, and time. She has normal strength. No cranial nerve deficit.  Skin: Skin is warm, dry and intact. No rash noted. No erythema. No pallor.  Psychiatric: She has a normal mood and affect. Her speech is normal and behavior is normal. Her mood appears not anxious.    ED Course  Procedures (including critical care time)   Medications  ketorolac (TORADOL) 30 MG/ML injection 30 mg (30 mg Intravenous Given 06/01/12 2217)  metoCLOPramide (REGLAN) injection 10 mg (10 mg Intravenous Given  06/01/12 2217)  diphenhydrAMINE (BENADRYL) injection 25 mg (25 mg Intravenous Given 06/01/12 2217)    DIAGNOSTIC STUDIES: Oxygen Saturation is 96% on room air, adequate by my interpretation.    COORDINATION OF CARE: 10:02 PM- Patient informed of clinical course, understands medical decision-making process, and agrees with plan.  Ordered IV Toradol, IV Reglan, IV benadryl, troponin, CK total and CKMB, CBC, b-met, and chest XR.  23:15 Pt states her pain is gone, discussed her test results.   Results for orders placed during the hospital encounter of 06/01/12  CBC      Component Value Range   WBC 9.8  4.0 - 10.5 K/uL   RBC 4.57  3.87 - 5.11 MIL/uL   Hemoglobin 13.1  12.0 - 15.0 g/dL   HCT 16.1  09.6 - 04.5 %   MCV 82.9  78.0 - 100.0 fL   MCH 28.7  26.0 - 34.0 pg   MCHC 34.6  30.0 - 36.0 g/dL   RDW 40.9  81.1 - 91.4 %   Platelets 300  150 - 400 K/uL  BASIC METABOLIC PANEL      Component Value Range   Sodium 140  135 - 145 mEq/L   Potassium 3.1 (*) 3.5 - 5.1 mEq/L   Chloride 103  96 - 112 mEq/L   CO2 27  19 - 32 mEq/L   Glucose, Bld 123 (*) 70 - 99 mg/dL   BUN 18  6 - 23 mg/dL   Creatinine, Ser 7.82  0.50 - 1.10 mg/dL   Calcium 9.6  8.4 - 95.6 mg/dL   GFR calc non Af Amer >90  >90 mL/min   GFR calc Af Amer >90  >90 mL/min  TROPONIN I      Component Value Range   Troponin I <0.30  <0.30 ng/mL  CK TOTAL AND CKMB      Component Value Range   Total CK 68  7 - 177 U/L   CK, MB 1.9  0.3 - 4.0 ng/mL   Relative Index RELATIVE INDEX IS INVALID  0.0 - 2.5  POCT PREGNANCY, URINE      Component Value Range   Preg Test, Ur NEGATIVE  NEGATIVE   Laboratory interpretation all normal except hypokalemia   Dg Chest Port 1 View  06/01/2012  *RADIOLOGY REPORT*  Clinical Data: Chest pain and difficulty breathing.  PORTABLE CHEST - 1 VIEW  Comparison: Chest x-ray 01/01/2011.  Findings: Lung volumes are normal.  No consolidative airspace disease.  No pleural effusions.  No pneumothorax.  No  pulmonary nodule or mass noted.  Pulmonary vasculature and the cardiomediastinal silhouette are within normal limits.  IMPRESSION: 1. No radiographic evidence of acute cardiopulmonary disease.   Original Report Authenticated By: Trudie Reed, M.D.      Date: 06/01/2012  Rate: 103  Rhythm: sinus tachycardia  QRS Axis: left  Intervals: normal  ST/T Wave abnormalities: normal  Conduction Disutrbances:left  anterior fascicular block  Narrative Interpretation:   Old EKG Reviewed: unchanged from 01/01/2011 HR 94      No diagnosis found.    MDM   I personally performed the services described in this documentation, which was scribed in my presence. The recorded information has been reviewed and considered.  Devoria Albe, MD, Armando Gang         Ward Givens, MD 06/01/12 [phone]

## 2012-06-03 ENCOUNTER — Encounter (HOSPITAL_COMMUNITY): Payer: Self-pay | Admitting: *Deleted

## 2012-06-03 ENCOUNTER — Emergency Department (HOSPITAL_COMMUNITY): Payer: Managed Care, Other (non HMO)

## 2012-06-03 ENCOUNTER — Emergency Department (HOSPITAL_COMMUNITY)
Admission: EM | Admit: 2012-06-03 | Discharge: 2012-06-04 | Disposition: A | Discharge status: Home / Self Care | Payer: Managed Care, Other (non HMO) | Attending: Emergency Medicine | Admitting: Emergency Medicine

## 2012-06-03 DIAGNOSIS — R1013 Epigastric pain: Secondary | ICD-10-CM

## 2012-06-03 DIAGNOSIS — R071 Chest pain on breathing: Secondary | ICD-10-CM | POA: Insufficient documentation

## 2012-06-03 DIAGNOSIS — R0602 Shortness of breath: Secondary | ICD-10-CM

## 2012-06-03 DIAGNOSIS — M79609 Pain in unspecified limb: Secondary | ICD-10-CM | POA: Insufficient documentation

## 2012-06-03 DIAGNOSIS — R0789 Other chest pain: Secondary | ICD-10-CM

## 2012-06-03 LAB — BASIC METABOLIC PANEL
Calcium: 9.5 mg/dL (ref 8.4–10.5)
GFR calc Af Amer: >90 mL/min (ref >90)
GFR calc non Af Amer: >90 mL/min (ref >90)
Potassium: 3.6 mEq/L (ref 3.5–5.1)
Sodium: 140 mEq/L (ref 135–145)

## 2012-06-03 LAB — TROPONIN I: Troponin I: <0.3 ng/mL (ref <0.30)

## 2012-06-03 LAB — CBC
Hemoglobin: 13.3 g/dL (ref 12.0–15.0)
Platelets: 329 10*3/uL (ref 150–400)
RBC: 4.61 MIL/uL (ref 3.87–5.11)
WBC: 11.1 10*3/uL — ABNORMAL HIGH (ref 4.0–10.5)

## 2012-06-03 LAB — D-DIMER, QUANTITATIVE: D-Dimer, Quant: 0.75 ug/mL-FEU — ABNORMAL HIGH (ref 0.00–0.48)

## 2012-06-03 MED ORDER — SODIUM CHLORIDE 0.9 % IV BOLUS (SEPSIS)
1000.0000 mL | Freq: Once | INTRAVENOUS | Status: AC
  Administered 2012-06-03: 1000 mL via INTRAVENOUS

## 2012-06-03 MED ORDER — GI COCKTAIL ~~LOC~~
30.0000 mL | Freq: Once | ORAL | Status: AC
  Administered 2012-06-03: 30 mL via ORAL
  Filled 2012-06-03: qty 30

## 2012-06-03 MED ORDER — KETOROLAC TROMETHAMINE 30 MG/ML IJ SOLN
15.0000 mg | Freq: Once | INTRAMUSCULAR | Status: AC
  Administered 2012-06-03: 15 mg via INTRAVENOUS
  Filled 2012-06-03: qty 1

## 2012-06-03 NOTE — ED Notes (Signed)
Pt c/o chest pain radiating to left back and arm. Pt c/o sob and left arm numbness.

## 2012-06-03 NOTE — ED Provider Notes (Signed)
History   This chart was scribed for Jones Skene, MD, by Frederik Pear, ER scribe. The patient was seen in room APA08/APA08 and the patient's care was started at 2131.    CSN: [id number]  Arrival date & time 06/03/12  2109   First MD Initiated Contact with Patient 06/03/12 2131      Chief Complaint  Patient presents with  . Chest Pain  . Shortness of Breath  . Arm Pain    (Consider location/radiation/quality/duration/timing/severity/associated sxs/prior treatment) HPI  Sharon White is a 41 y.o. female who presents to the Emergency Department complaining of intermittent, moderate central chest pain that shoots up to her left arm and back with associated SOB, nausea and left arm numbness that began PTA.  She states that the pain lasts for approximately 3 minutes and is aggravated by sitting down. She states that she experienced diaphoresis in her hands when the pain initially began. She states that she came to the ED 2 days ago for chest pain, but the current pain is different because it is on the opposite side of her chest and radiates. She denies any fever or cough and has not been sick recently.  She has no h/o of heart conditions or blood clots. Her family has no h/o of blood clots or heart disease other than one uncle who experienced heart conditions in his 66's. She denies any accidents where she may have injured her neck. She is a nonsmoker, and does not take birth control bills, drink alcohol or use street drugs. She denies any recent travel. She is currently taking potassium and ibuprofen from her previous ED visit.  History reviewed. No pertinent past medical history.  History reviewed. No pertinent past surgical history.  No family history on file.  History  Substance Use Topics  . Smoking status: Not on file  . Smokeless tobacco: Not on file  . Alcohol Use: No    OB History    Grav Para Term Preterm Abortions TAB SAB Ect Mult Living                  Review  of Systems At least 10pt or greater review of systems completed and are negative except where specified in the HPI. Allergies  Review of patient's allergies indicates no known allergies.  Home Medications   Current Outpatient Rx  Name  Route  Sig  Dispense  Refill  . IBUPROFEN 200 MG PO TABS   Oral   Take 400 mg by mouth once as needed. For pain         . POTASSIUM CHLORIDE CRYS ER 20 MEQ PO TBCR   Oral   Take 1 tablet (20 mEq total) by mouth 2 (two) times daily.   10 tablet   0     BP 120/72  Pulse 104  Temp 97.8 F (36.6 C) (Oral)  Resp 22  Ht 4\' 9"  (1.448 m)  Wt 146 lb (66.225 kg)  BMI 31.59 kg/m2  SpO2 95%  LMP 06/02/2012  Physical Exam    Nursing notes reviewed.  Electronic medical record reviewed. VITAL SIGNS:   Filed Vitals:   06/03/12 2128 06/04/12 0030 06/04/12 0130 06/04/12 0145  BP:      Pulse:  89 78 83  Temp: 97.8 F (36.6 C)     TempSrc: Oral     Resp:  21 17 17   Height:      Weight:      SpO2:  95% 94% 95%  CONSTITUTIONAL: Awake, oriented, appears non-toxic HENT: Atraumatic, normocephalic, oral mucosa pink and moist, airway patent. Nares patent without drainage. External ears normal. EYES: Conjunctiva clear, EOMI, PERRLA NECK: Trachea midline, non-tender in the midline, mild tenderness to palpation left-sided cervical paraspinous muscles and in the trapezium on the left, supple CARDIOVASCULAR: Tachycardic, Normal rhythm, No murmurs, rubs, gallops PULMONARY/CHEST: Clear to auscultation, no rhonchi, wheezes, or rales. Symmetrical breath sounds. Tender to palpation mid axillary line about the fifth and sixth ribs ABDOMINAL: Non-distended, soft, non-tender - no rebound or guarding.  BS normal. NEUROLOGIC: Non-focal, moving all four extremities, no gross sensory or motor deficits. EXTREMITIES: No clubbing, cyanosis, or edema SKIN: Warm, Dry, No erythema, No rash  ED Course  Procedures (including critical care time)   Date: 06/04/2012  Rate:  108  Rhythm: normal sinus rhythm  QRS Axis: normal  Intervals: normal  ST/T Wave abnormalities: normal  Conduction Disutrbances: none  Narrative Interpretation: Sinus tachycardia     DIAGNOSTIC STUDIES: Oxygen Saturation is 95% on room air, adequate by my interpretation.    COORDINATION OF CARE:  21:42- Discussed planned course of treatment with the patient, including a chest X-ray and blood work, who is agreeable at this time.  21:45- gi cocktail (Maalox, Lidocaine, Donnatal)- Once, ketorolac (TORADOL) 30 mg/ml injection 15 mg- Once, sodium chloride 0.9% bolus 1,000 mL-Once.  Labs Reviewed - No data to display Dg Chest 2 View  06/03/2012  *RADIOLOGY REPORT*  Clinical Data: Chest pain and dizziness; left arm pain.  Shortness of breath.  CHEST - 2 VIEW  Comparison: Chest radiograph performed 06/01/2012  Findings: The lungs are hypoexpanded; mild bibasilar airspace opacities likely reflect atelectasis.  No pleural effusion or pneumothorax is identified.  The heart is borderline normal in size; the mediastinal contour is within normal limits.  No acute osseous abnormalities are seen.  IMPRESSION: Lungs hypoexpanded, with likely mild bibasilar atelectasis.   Original Report Authenticated By: Tonia Ghent, M.D.    Ct Angio Chest Pe W/cm &/or Wo Cm  06/04/2012  *RADIOLOGY REPORT*  Clinical Data: Chest pain and shortness of breath.  CT ANGIOGRAPHY CHEST  Technique:  Multidetector CT imaging of the chest using the standard protocol during bolus administration of intravenous contrast. Multiplanar reconstructed images including MIPs were obtained and reviewed to evaluate the vascular anatomy.  Contrast: OMNIPAQUE IOHEXOL 350 MG/ML SOLN  Comparison: 01/01/2011  Findings: The pulmonary arteries are very well opacified.  There is no evidence of pulmonary embolism.  The thoracic aorta is of normal caliber.  The heart is mildly enlarged.  No pleural or pericardial fluid is seen.  No evidence of  pulmonary nodule or enlarged lymph nodes.  The lungs show mild interstitial and pulmonary vascular prominence without overt edema.  No focal pulmonary consolidation is seen.  The visualized airways are normally patent.  No bony abnormalities identified.  IMPRESSION: No evidence of acute pulmonary embolism.  Mild pulmonary interstitial and vascular prominence is noted without overt edema.   Original Report Authenticated By: Irish Lack, M.D.      1. Epigastric pain   2. Shortness of breath   3. Chest wall pain       Medications  omeprazole (PRILOSEC) 20 MG capsule (not administered)  calcium carbonate (TUMS E-X 750) 750 MG chewable tablet (not administered)  gi cocktail (Maalox,Lidocaine,Donnatal) (30 mL Oral Given 06/03/12 2212)  ketorolac (TORADOL) 30 MG/ML injection 15 mg (15 mg Intravenous Given 06/03/12 2220)  sodium chloride 0.9 % bolus 1,000 mL (0 mL Intravenous Stopped  06/03/12 2317)  alum & mag hydroxide-simeth (MAALOX/MYLANTA) 200-200-20 MG/5ML suspension 30 mL (30 mL Oral Given 06/04/12 0026)  iohexol (OMNIPAQUE) 350 MG/ML injection 100 mL (100 mL Intravenous Contrast Given 06/04/12 0100)    MDM  PATSI HAEUSSLER is a 41 y.o. female presenting to the ED with chest pain. She does describe an epigastric pain that radiates upward through the chest it occasionally worsens-she was worked up a couple of days ago for chest pain in the emergency department, found to have chest wall pain and discharged ibuprofen and some potassium supplements for hypokalemia.  Patient's had continued symptoms since then and today they worsened. The patient's overall risk for ACS at this time is low; however I am concerned for PE. Patient is tachycardic and described shortness of breath - she has a Wells score of 4 and so I will obtain a d-dimer to further stratify.  We'll also give the patient GI cocktail to assess whether this helps her out as this pain is radiating from her belly could be GERD or ulcer.  No infectious signs about this is a pneumonia or any parenchymal lung disease.  EKG shows sinus tachycardia. Treat her with fluids.  Patient did receive relief with first GI cocktail however the pain started coming back shortly thereafter. Patient's d-dimer is positive will obtain CT angiogram chest. Troponin is negative. CT angiogram of the chest is also negative. There is some mild pulmonary interstitial and vascular prominence noted without overt edema. Again I do not think she's got an infectious process at this time, her heart is nonenlarged - not sure the etiology of this finding is.   Put the patient on omeprazole for 4-6 weeks as a trial to see that helps her pain out. She does have a primary care physician in [place] who she can followup with next week either Monday or Tuesday.  I've also told her to use TUMS as needed because the PPI will take weeks to work.  Patient did respond well to a second dose of Mylanta further supporting a GI source of pain. Patient's tachycardia resolved with fluid.  I explained the diagnosis and have given explicit precautions to return to the ER including worsening chest pain, shortness of breath or any other new or worsening symptoms. The patient understands and accepts the medical plan as it's been dictated and I have answered their questions. Discharge instructions concerning home care and prescriptions have been given.  The patient is STABLE and is discharged to home in good condition.   I personally performed the services described in this documentation, which was scribed in my presence. The recorded information has been reviewed and is accurate. Jones Skene, M.D.          Jones Skene, MD 06/04/12 [phone]

## 2012-06-04 MED ORDER — IOHEXOL 350 MG/ML SOLN
100.0000 mL | Freq: Once | INTRAVENOUS | Status: AC | PRN
  Administered 2012-06-04: 100 mL via INTRAVENOUS

## 2012-06-04 MED ORDER — CALCIUM CARBONATE ANTACID 750 MG PO CHEW: 1.0000 | CHEWABLE_TABLET | Freq: Every day | ORAL | Status: DC

## 2012-06-04 MED ORDER — ALUM & MAG HYDROXIDE-SIMETH 200-200-20 MG/5ML PO SUSP
30.0000 mL | Freq: Once | ORAL | Status: AC
  Administered 2012-06-04: 30 mL via ORAL
  Filled 2012-06-04: qty 30

## 2012-06-04 MED ORDER — OMEPRAZOLE 20 MG PO CPDR: 20.0000 mg | DELAYED_RELEASE_CAPSULE | Freq: Every day | ORAL | Status: DC

## 2012-06-04 NOTE — ED Notes (Signed)
Patient complaining of epigastric pain returning and radiating to chest. Dr. Rulon Abide notified.

## 2013-06-25 ENCOUNTER — Emergency Department (HOSPITAL_COMMUNITY): Payer: Managed Care, Other (non HMO)

## 2013-06-25 ENCOUNTER — Emergency Department (HOSPITAL_COMMUNITY)
Admission: EM | Admit: 2013-06-25 | Discharge: 2013-06-25 | Disposition: A | Discharge status: Home / Self Care | Payer: Managed Care, Other (non HMO) | Attending: Emergency Medicine | Admitting: Emergency Medicine

## 2013-06-25 ENCOUNTER — Encounter (HOSPITAL_COMMUNITY): Payer: Self-pay | Admitting: Emergency Medicine

## 2013-06-25 DIAGNOSIS — Z79899 Other long term (current) drug therapy: Secondary | ICD-10-CM | POA: Insufficient documentation

## 2013-06-25 DIAGNOSIS — R079 Chest pain, unspecified: Secondary | ICD-10-CM

## 2013-06-25 LAB — COMPREHENSIVE METABOLIC PANEL
ALT: 15 U/L (ref 0–35)
AST: 14 U/L (ref 0–37)
Alkaline Phosphatase: 85 U/L (ref 39–117)
CO2: 25 mEq/L (ref 19–32)
GFR calc Af Amer: >90 mL/min (ref >90)
GFR calc non Af Amer: >90 mL/min (ref >90)
Glucose, Bld: 128 mg/dL — ABNORMAL HIGH (ref 70–99)
Potassium: 3.5 mEq/L (ref 3.5–5.1)
Sodium: 138 mEq/L (ref 135–145)

## 2013-06-25 LAB — CBC WITH DIFFERENTIAL/PLATELET
Basophils Absolute: 0 10*3/uL (ref 0.0–0.1)
Lymphocytes Relative: 27 % (ref 12–46)
Lymphs Abs: 3 10*3/uL (ref 0.7–4.0)
Neutro Abs: 7.1 10*3/uL (ref 1.7–7.7)
Neutrophils Relative %: 63 % (ref 43–77)
Platelets: 303 10*3/uL (ref 150–400)
RBC: 4.53 MIL/uL (ref 3.87–5.11)
WBC: 11.2 10*3/uL — ABNORMAL HIGH (ref 4.0–10.5)

## 2013-06-25 MED ORDER — KETOROLAC TROMETHAMINE 60 MG/2ML IM SOLN
60.0000 mg | Freq: Once | INTRAMUSCULAR | Status: AC
  Administered 2013-06-25: 60 mg via INTRAMUSCULAR
  Filled 2013-06-25: qty 2

## 2013-06-25 NOTE — ED Notes (Signed)
Patient with history of chest tightness and was seen by her MD and was told it was anxiety.  Patient has been experiencing the chest tightness for about a month and two days ago it got worse.  Pain is on her left side of chest radiating to her left side of her face/neck, left arm, and back.  Patient also c/o dizziness and shortness of breath.

## 2013-06-25 NOTE — ED Provider Notes (Signed)
CSN: [id number]     Arrival date & time 06/25/13  0015 History   First MD Initiated Contact with Patient 06/25/13 0040     Chief Complaint  Patient presents with  . Chest Pain   (Consider location/radiation/quality/duration/timing/severity/associated sxs/prior Treatment) HPI Comments: Patient is a 42 year old female with chest pain for the past several days. His left side of her chest that hurts and radiates into her neck and left shoulder. She states she feels short of breath but denies nausea or diaphoresis. There seems to be no relation to exertion and she denies any fevers, chills, or productive cough. Upon reviewing her chart, it appears as though this has been going on for several years. She has had multiple CT angiograms of her chest to rule out PE and has seen her doctor many times. She was told this was anxiety and was started on Klonopin. She does not feel as though this is helping.  Patient is a 42 y.o. female presenting with chest pain. The history is provided by the patient.  Chest Pain Pain location:  L chest Pain quality: sharp   Pain radiates to:  L jaw Pain radiates to the back: no   Pain severity:  Moderate Onset quality:  Gradual Duration:  2 days Timing:  Constant Progression:  Worsening Chronicity:  New Context: breathing, lifting, movement and raising an arm   Relieved by:  Nothing Worsened by:  Nothing tried Ineffective treatments:  None tried Associated symptoms: no shortness of breath and not vomiting     History reviewed. No pertinent past medical history. History reviewed. No pertinent past surgical history. No family history on file. History  Substance Use Topics  . Smoking status: Never Smoker   . Smokeless tobacco: Not on file  . Alcohol Use: No   OB History   Grav Para Term Preterm Abortions TAB SAB Ect Mult Living                 Review of Systems  Respiratory: Negative for shortness of breath.   Cardiovascular: Positive for chest pain.   Gastrointestinal: Negative for vomiting.  All other systems reviewed and are negative.    Allergies  Review of patient's allergies indicates no known allergies.  Home Medications   Current Outpatient Rx  Name  Route  Sig  Dispense  Refill  . clonazePAM (KLONOPIN) 1 MG tablet   Oral   Take 1 mg by mouth daily.         . fexofenadine (ALLEGRA) 180 MG tablet   Oral   Take 180 mg by mouth daily.         Marland Kitchen omeprazole (PRILOSEC) 20 MG capsule   Oral   Take 1 capsule (20 mg total) by mouth daily.   30 capsule   1   . calcium carbonate (TUMS E-X 750) 750 MG chewable tablet   Oral   Chew 1-2 tablets (750-1,500 mg total) by mouth daily.   100 tablet   0   . ibuprofen (ADVIL,MOTRIN) 200 MG tablet   Oral   Take 400 mg by mouth every 6 (six) hours as needed. For pain          BP 104/66  Pulse 82  Temp(Src) 98.1 F (36.7 C) (Oral)  Resp 18  Ht 4\' 11"  (1.499 m)  Wt 148 lb (67.132 kg)  BMI 29.88 kg/m2  SpO2 99% Physical Exam  Nursing note and vitals reviewed. Constitutional: She is oriented to person, place, and time. She appears well-developed  and well-nourished. No distress.  HENT:  Head: Normocephalic and atraumatic.  Neck: Normal range of motion. Neck supple.  Cardiovascular: Normal rate and regular rhythm.  Exam reveals no gallop and no friction rub.   No murmur heard. Pulmonary/Chest: Effort normal and breath sounds normal. No respiratory distress. She has no wheezes.  Abdominal: Soft. Bowel sounds are normal. She exhibits no distension. There is no tenderness.  Musculoskeletal: Normal range of motion.  Neurological: She is alert and oriented to person, place, and time.  Skin: Skin is warm and dry. She is not diaphoretic.    ED Course  Procedures (including critical care time) Labs Review Labs Reviewed  CBC WITH DIFFERENTIAL  COMPREHENSIVE METABOLIC PANEL  TROPONIN I   Imaging Review No results found.  EKG Interpretation    Date/Time:  Monday  June 25 2013 00:18:56 EST Ventricular Rate:  86 PR Interval:  140 QRS Duration: 78 QT Interval:  366 QTC Calculation: 437 R Axis:   62 Text Interpretation:  Normal sinus rhythm Normal ECG When compared with ECG of 03-Jun-2012 21:30, Borderline criteria for Inferior infarct are no longer Present T wave inversion no longer evident in Inferior leads Confirmed by DELOS  MD, Varnika Butz ([id number]) on 06/25/2013 12:41:15 AM            MDM  No diagnosis found. Patient is a 42 year old female with long-standing history of intermittent chest discomfort. She has been worked up for this in the past with with at least 2 CT angiogram of the chest. She states it has been worse over the past 3 days. Her symptoms do not sound cardiac in nature and workup is unremarkable including EKG and troponin. Her chest x-ray is clear. I suspect either a musculoskeletal or anxiety component.  I will recommend she call her primary Dr. arrange a followup appointment to discuss whether a stress test or other study would be appropriate. She is not hypoxic, tachypneic, tachycardic, and I have very little suspicion for PE. As she has had 2 prior PE studies for this I am reluctant to perform another.    Geoffery Lyons, MD 06/25/13 [phone]

## 2013-08-22 ENCOUNTER — Emergency Department (HOSPITAL_COMMUNITY)
Admission: EM | Admit: 2013-08-22 | Discharge: 2013-08-22 | Disposition: A | Discharge status: Home / Self Care | Payer: Managed Care, Other (non HMO) | Attending: Emergency Medicine | Admitting: Emergency Medicine

## 2013-08-22 ENCOUNTER — Encounter (HOSPITAL_COMMUNITY): Payer: Self-pay | Admitting: Emergency Medicine

## 2013-08-22 DIAGNOSIS — Z79899 Other long term (current) drug therapy: Secondary | ICD-10-CM | POA: Insufficient documentation

## 2013-08-22 DIAGNOSIS — Z77098 Contact with and (suspected) exposure to other hazardous, chiefly nonmedicinal, chemicals: Secondary | ICD-10-CM

## 2013-08-22 DIAGNOSIS — R6889 Other general symptoms and signs: Secondary | ICD-10-CM | POA: Insufficient documentation

## 2013-08-22 DIAGNOSIS — R209 Unspecified disturbances of skin sensation: Secondary | ICD-10-CM | POA: Insufficient documentation

## 2013-08-22 NOTE — ED Notes (Signed)
Pt reports spraying deicer on window and a little got in her mouth.  Reports that she is feeling some burning on tongue and lips. Pt reports rinsing mouth with water shortly after exposure. No redness or blisters noted to mouth.  Pt also reporting epigastric pain.  Has history of same.
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
## 2013-08-22 NOTE — Discharge Instructions (Signed)
Please wash face to 3 times daily with cold water. Please presents mildly lips cold water to 3 times daily. Please return if any shortness of breath, difficulty swallowing, changes or problems.

## 2013-08-26 NOTE — ED Provider Notes (Signed)
Medical screening examination/treatment/procedure(s) were performed by non-physician practitioner and as supervising physician I was immediately available for consultation/collaboration.  EKG Interpretation   None        Derral Colucci, MD 08/26/13 2052 

## 2015-07-06 HISTORY — PX: CHOLECYSTECTOMY: SHX55

## 2015-11-25 IMAGING — CR DG CHEST 2V
2 series · 2 of 2 positions shown · non-contrast
Comparison: Prior CT from 06/04/2012

CLINICAL DATA: The external and left-sided chest pain

EXAM:
CHEST  2 VIEW

[view not recorded (1 of 2)]
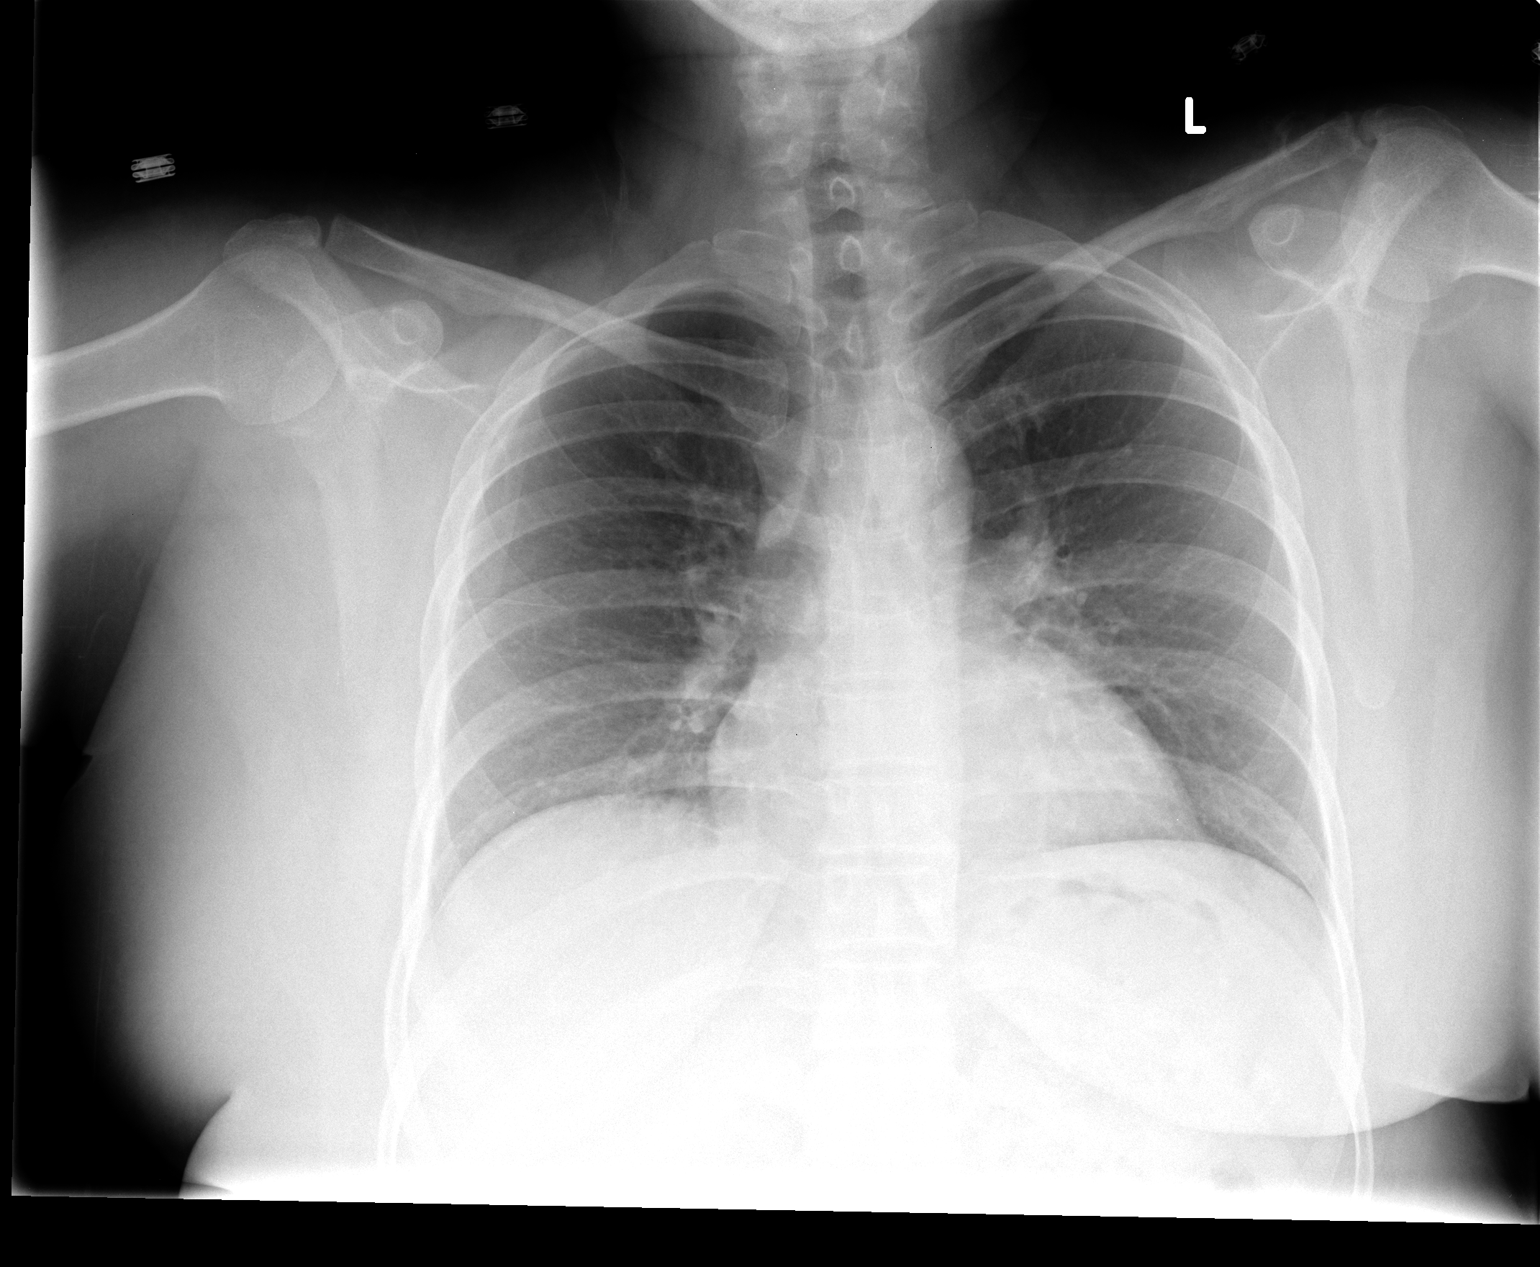

[view not recorded (2 of 2)]
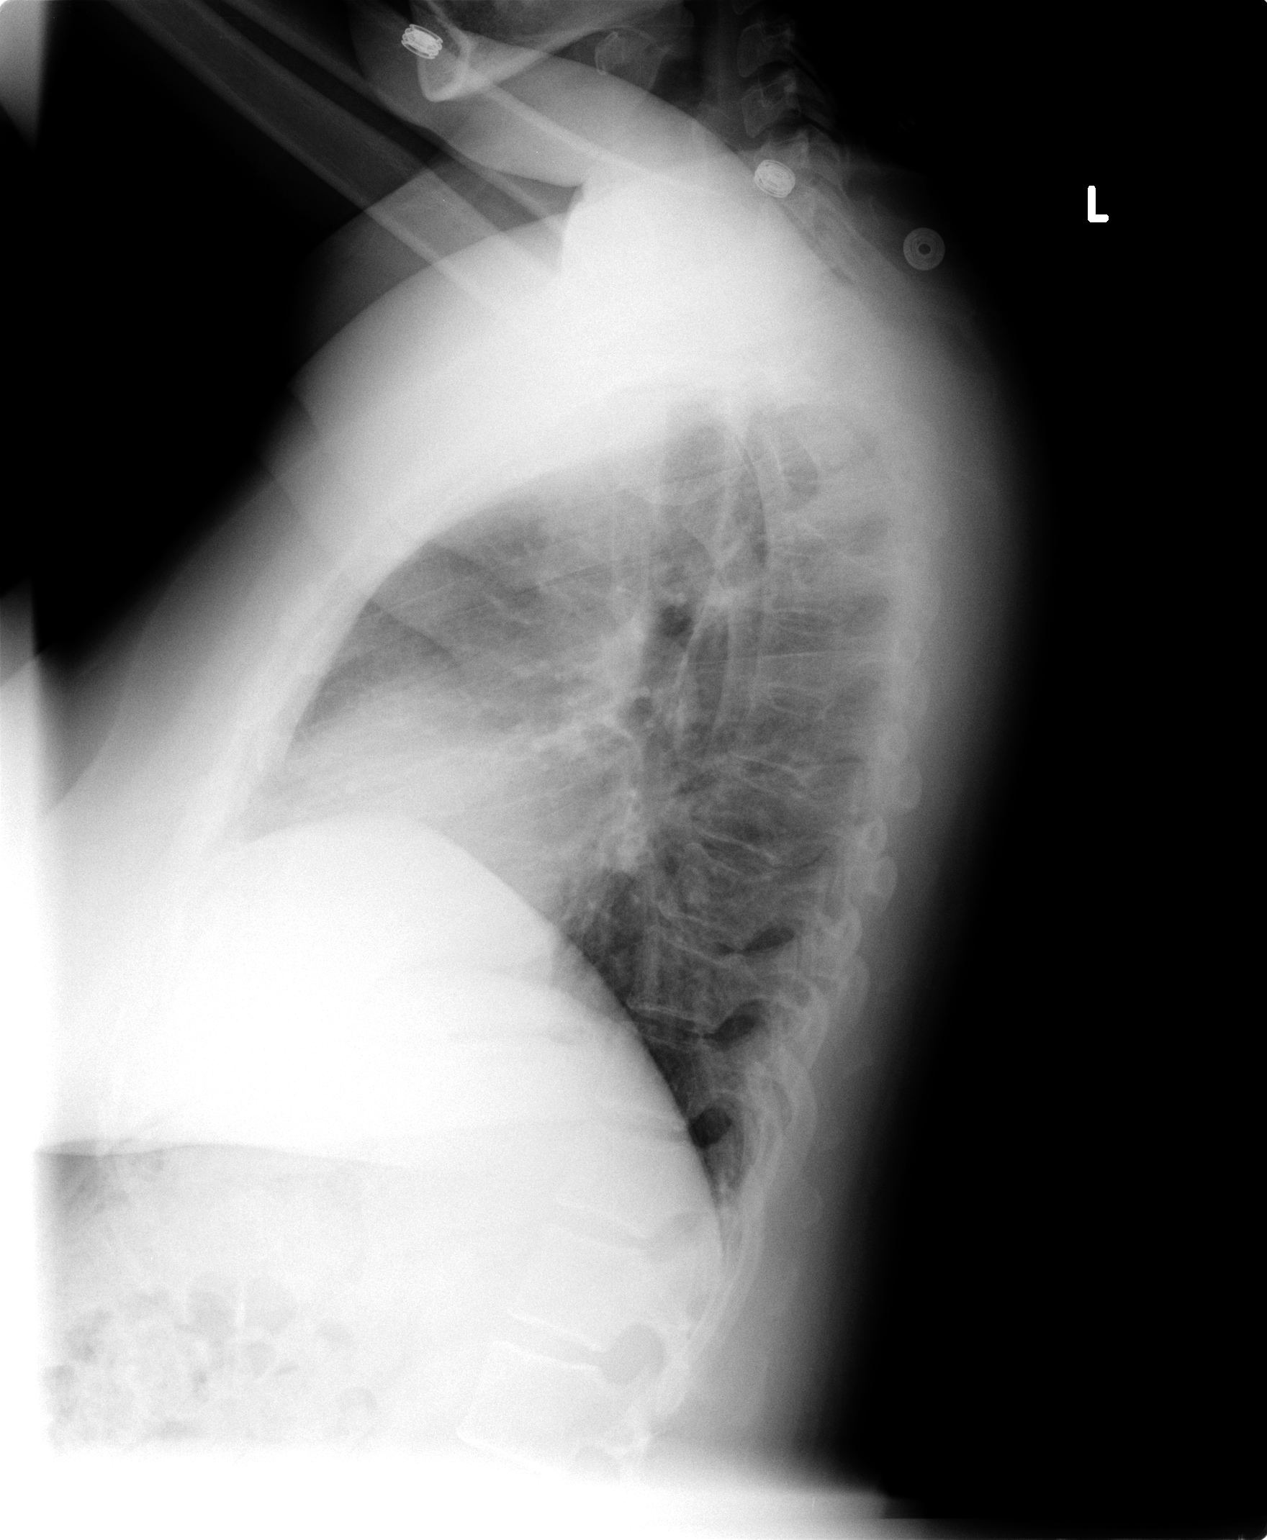

[2 of 2 positions shown; findings below may reference images not displayed]

FINDINGS: The cardiac and mediastinal silhouettes are stable in size and
contour, and remain within normal limits.

The lungs are normally inflated. No airspace consolidation, pleural
effusion, or pulmonary edema is identified. There is no
pneumothorax.

No acute osseous abnormality identified.
IMPRESSION: No active cardiopulmonary disease.

## 2021-12-18 ENCOUNTER — Other Ambulatory Visit: Payer: Self-pay

## 2021-12-18 DIAGNOSIS — Z1231 Encounter for screening mammogram for malignant neoplasm of breast: Secondary | ICD-10-CM

## 2022-02-16 ENCOUNTER — Ambulatory Visit: Payer: Self-pay | Admitting: *Deleted

## 2022-02-16 ENCOUNTER — Ambulatory Visit: Payer: Managed Care, Other (non HMO)

## 2022-02-16 ENCOUNTER — Ambulatory Visit
Admission: RE | Admit: 2022-02-16 | Discharge: 2022-02-16 | Disposition: A | Discharge status: Home / Self Care | Payer: No Typology Code available for payment source | Source: Ambulatory Visit | Attending: Family Medicine | Admitting: Family Medicine

## 2022-02-16 VITALS — BP 108/78 | Wt 149.3 lb

## 2022-02-16 DIAGNOSIS — Z1239 Encounter for other screening for malignant neoplasm of breast: Secondary | ICD-10-CM

## 2022-02-16 DIAGNOSIS — Z1231 Encounter for screening mammogram for malignant neoplasm of breast: Secondary | ICD-10-CM

## 2022-02-16 DIAGNOSIS — Z1211 Encounter for screening for malignant neoplasm of colon: Secondary | ICD-10-CM

## 2022-02-16 NOTE — Patient Instructions (Signed)
Explained breast self awareness with Sharon White. Patient did not need a Pap smear today due to last Pap smear and HPV typing was 01/30/2020. Let her know [facility] will cover Pap smears and HPV typing every 5 years unless has a history of abnormal Pap smears. Referred patient to the [facility] for a screening mammogram. Appointment scheduled Tuesday, February 16, 2022 at 1420. Patient aware of appointment and will be there. Let patient know the Breast Center will follow up with her within the next couple weeks with results of her mammogram by letter or phone. Sharon White verbalized understanding.  Sharon White, Sharon Maser, RN 2:21 PM

## 2022-02-16 NOTE — Progress Notes (Signed)
[name] is a 51 y.o. female who presents to [facility] clinic today with no complaints.    Pap Smear: Pap smear not completed today. Last Pap smear was 01/30/2020 at [facility] clinic and was normal with negative HPV. Per patient has history of two abnormal Pap smears 17 and 18 years ago that colposcopies were completed for follow up. Patient stated all Pap smears have been normal since and that she has had at least three normal Pap smears. Last Pap smear result is available in Epic.   Physical exam: Breasts Breasts symmetrical. No skin abnormalities bilateral breasts. No nipple retraction bilateral breasts. No nipple discharge bilateral breasts. No lymphadenopathy. No lumps palpated bilateral breasts. No complaints of pain or tenderness on exam.   Pelvic/Bimanual Pap is not indicated today per [facility]CP guidelines.   Smoking History: Patient has never smoked.   Patient Navigation: Patient education provided. Access to services provided for patient through [facility] program.   Colorectal Cancer Screening: Per patient has never had colonoscopy completed. FIT Test given to patient to complete. No complaints today.    Breast and Cervical Cancer Risk Assessment: Patient does not have family history of breast cancer, known genetic mutations, or radiation treatment to the chest before age 39. Per patient has history of cervical dysplasia. Patient has no history of being immunocompromised or DES exposure in-utero.  Risk Assessment     Risk Scores       02/16/2022   Last edited by: Sharon White, CMA   5-year risk: 2.9 %   Lifetime risk: 26.2 %            A: [facility] exam without pap smear No complaints.  P: Referred patient to the [facility] for a screening mammogram. Appointment scheduled Tuesday, February 16, 2022 at 1420.  Priscille Heidelberg, RN 02/16/2022 2:21 PM

## 2023-09-19 ENCOUNTER — Other Ambulatory Visit: Payer: Self-pay | Admitting: Obstetrics and Gynecology

## 2023-09-19 DIAGNOSIS — Z1231 Encounter for screening mammogram for malignant neoplasm of breast: Secondary | ICD-10-CM

## 2023-10-27 ENCOUNTER — Ambulatory Visit: Payer: Self-pay

## 2023-10-27 ENCOUNTER — Encounter

## 2023-12-16 ENCOUNTER — Inpatient Hospital Stay: Payer: Self-pay | Attending: Obstetrics and Gynecology | Admitting: *Deleted

## 2023-12-16 ENCOUNTER — Ambulatory Visit (HOSPITAL_COMMUNITY)
Admission: RE | Admit: 2023-12-16 | Discharge: 2023-12-16 | Disposition: A | Discharge status: Home / Self Care | Payer: Self-pay | Source: Ambulatory Visit | Attending: Obstetrics and Gynecology | Admitting: Obstetrics and Gynecology

## 2023-12-16 VITALS — BP 113/56 | Wt 144.0 lb

## 2023-12-16 DIAGNOSIS — Z1231 Encounter for screening mammogram for malignant neoplasm of breast: Secondary | ICD-10-CM | POA: Insufficient documentation

## 2023-12-16 DIAGNOSIS — Z1239 Encounter for other screening for malignant neoplasm of breast: Secondary | ICD-10-CM

## 2023-12-16 NOTE — Patient Instructions (Signed)
 Explained breast self awareness with Sharon White. Patient did not need a Pap smear today due to last Pap smear and HPV typing was 01/20/2020. Let her know [facility] will cover Pap smears and HPV typing every 5 years unless has a history of abnormal Pap smears. Referred patient to [facility] for a screening mammogram. Appointment scheduled Friday, December 16, 2023 at 1100. Patient aware of appointment and will be there. Let patient know [facility] will follow up with her within the next couple weeks with results of mammogram by letter or phone. Sharon White verbalized understanding.  Sharon White, Dela Favor, RN 10:08 AM

## 2023-12-16 NOTE — Progress Notes (Signed)
 [name] is a 53 y.o. female who presents to [facility] clinic today with no complaints.    Pap Smear: Pap smear not completed today. Last Pap smear was 01/30/2020 at [facility] clinic and was normal with negative HPV. Per patient has history of two abnormal Pap smears 19 and 20 years ago that colposcopies were completed for follow up. Patient stated all Pap smears have been normal since and that she has had at least three normal Pap smears. Last Pap smear result is available in Epic.    Physical exam: Breasts Breasts symmetrical. No skin abnormalities bilateral breasts. No nipple retraction bilateral breasts. No nipple discharge bilateral breasts. No lymphadenopathy. No lumps palpated bilateral breasts. No complaints of pain or tenderness on exam.  MS DIGITAL SCREENING TOMO BILATERAL Result Date: 02/18/2022 CLINICAL DATA:  Screening. EXAM: DIGITAL SCREENING BILATERAL MAMMOGRAM WITH TOMOSYNTHESIS AND CAD TECHNIQUE: Bilateral screening digital craniocaudal and mediolateral oblique mammograms were obtained. Bilateral screening digital breast tomosynthesis was performed. The images were evaluated with computer-aided detection. COMPARISON:  Previous exam(s). ACR Breast Density Category b: There are scattered areas of fibroglandular density. FINDINGS: There are no findings suspicious for malignancy. IMPRESSION: No mammographic evidence of malignancy. A result letter of this screening mammogram will be mailed directly to the patient. RECOMMENDATION: Screening mammogram in one year. (Code:SM-B-01Y) BI-RADS CATEGORY  1: Negative. Electronically Signed   By: Serena  Chacko M.D.   On: 02/18/2022 08:37    Pelvic/Bimanual Pap is not indicated today per B[facility]CP guidelines.   Smoking History: Patient has never smoked.   Patient Navigation: Patient education provided. Access to services provided for patient through [facility] program. Spanish interpreter Aretha Beers from [facility] provided.    Colorectal Cancer Screening: Per patient has never had colonoscopy completed. FIT Test given to patient to complete. No complaints today.    Breast and Cervical Cancer Risk Assessment: Patient does not have family history of breast cancer, known genetic mutations, or radiation treatment to the chest before age 67. Per patient has history of cervical dysplasia. Patient has no history of being immunocompromised or DES exposure in-utero.   Risk Scores as of Encounter on 12/16/2023     [name]           5-year 0.84%   Lifetime 8.45%            Last calculated by Silas, Ansyi K, CMA on 12/16/2023 at  9:55 AM        A: [facility] exam without pap smear No complaints.  P: Referred patient to [facility] for a screening mammogram. Appointment scheduled Friday, December 16, 2023 at 1100.  Stefan Edge, RN 12/16/2023 10:08 AM

## 2023-12-21 ENCOUNTER — Other Ambulatory Visit: Payer: Self-pay

## 2023-12-21 DIAGNOSIS — Z1211 Encounter for screening for malignant neoplasm of colon: Secondary | ICD-10-CM
